# Patient Record
Sex: Female | Born: 2000 | Race: Black or African American | Hispanic: No | Marital: Single | Smoking: Never smoker
Health system: Southern US, Community
[De-identification: ages and names within clinical notes are randomized; demographics above are authoritative.]

## PROBLEM LIST (undated history)

## (undated) DIAGNOSIS — J45909 Unspecified asthma, uncomplicated: Secondary | ICD-10-CM

## (undated) HISTORY — PX: NO PAST SURGERIES: SHX2092

---
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
## 2018-05-18 ENCOUNTER — Other Ambulatory Visit: Payer: Self-pay

## 2018-05-18 ENCOUNTER — Encounter: Payer: Self-pay | Admitting: Emergency Medicine

## 2018-05-18 ENCOUNTER — Ambulatory Visit
Admission: EM | Admit: 2018-05-18 | Discharge: 2018-05-18 | Disposition: A | Discharge status: Home / Self Care | Payer: BLUE CROSS/BLUE SHIELD | Attending: Family Medicine | Admitting: Family Medicine

## 2018-05-18 DIAGNOSIS — Z202 Contact with and (suspected) exposure to infections with a predominantly sexual mode of transmission: Secondary | ICD-10-CM | POA: Diagnosis not present

## 2018-05-18 LAB — CHLAMYDIA/NGC RT PCR (ARMC ONLY)
Chlamydia Tr: DETECTED — AB
N gonorrhoeae: NOT DETECTED

## 2018-05-18 LAB — URINALYSIS, COMPLETE (UACMP) WITH MICROSCOPIC
BILIRUBIN URINE: NEGATIVE
GLUCOSE, UA: NEGATIVE mg/dL
KETONES UR: NEGATIVE mg/dL
Nitrite: NEGATIVE
PROTEIN: NEGATIVE mg/dL
Specific Gravity, Urine: 1.025 (ref 1.005–1.030)
pH: 5.5 (ref 5.0–8.0)

## 2018-05-18 LAB — WET PREP, GENITAL
Clue Cells Wet Prep HPF POC: NONE SEEN
Sperm: NONE SEEN
Trich, Wet Prep: NONE SEEN
Yeast Wet Prep HPF POC: NONE SEEN

## 2018-05-18 NOTE — ED Triage Notes (Signed)
Patient would like to be tested of STDs.  Patient denies vaginal discharge.  Patient reports some increase in urinary urgency.

## 2018-05-18 NOTE — ED Provider Notes (Signed)
[facility]    CSN: [id number] Arrival date & time: 05/18/18  0855  History   Chief Complaint Chief Complaint  Patient presents with  . Exposure to STD   HPI  18 year old female presents for STD testing.  Patient states that she had unprotected intercourse approximately 2 weeks ago.  Patient desires STD screening/testing.  Patient states that she is essentially asymptomatic.  No fever.  No abdominal pain/pelvic pain.  No vaginal discharge.  Patient does report that she has had some urinary urgency.  Patient would like HIV screening as well.  No other complaints.  PMH, Surgical Hx, Family Hx, Social History reviewed and updated as below.  Past Medical History:  Diagnosis Date  . Asthma    Past Surgical History:  Procedure Laterality Date  . NO PAST SURGERIES     OB History   No obstetric history on file.    Home Medications    Prior to Admission medications   Medication Sig Start Date End Date Taking? Authorizing Provider  etonogestrel (NEXPLANON) 68 MG IMPL implant 1 each by Subdermal route once.   Yes [provider]  mupirocin ointment (BACTROBAN) 2 % Apply three times a day for 7 days. 06/25/16   Renford Dills, NP    Family History Family History  Problem Relation Age of Onset  . Healthy Mother   . Healthy Father     Social History Social History   Tobacco Use  . Smoking status: Never Smoker  . Smokeless tobacco: Never Used  Substance Use Topics  . Alcohol use: No  . Drug use: No     Allergies   Patient has no known allergies.   Review of Systems Review of Systems  Constitutional: Negative for fever.  Gastrointestinal: Negative.   Genitourinary: Positive for urgency. Negative for pelvic pain and vaginal discharge.   Physical Exam Triage Vital Signs ED Triage Vitals  Enc Vitals Group     BP 05/18/18 0906 111/68     Pulse Rate 05/18/18 0906 71     Resp 05/18/18 0906 14     Temp 05/18/18 0906 99.1 F (37.3 C)     Temp  Source 05/18/18 0906 Oral     SpO2 05/18/18 0906 100 %     Weight 05/18/18 0904 112 lb (50.8 kg)     Height 05/18/18 0904 5\' 2"  (1.575 m)     Head Circumference --      Peak Flow --      Pain Score 05/18/18 0904 0     Pain Loc --      Pain Edu? --      Excl. in GC? --    Updated Vital Signs BP 111/68 (BP Location: Left Arm)   Pulse 71   Temp 99.1 F (37.3 C) (Oral)   Resp 14   Ht 5\' 2"  (1.575 m)   Wt 50.8 kg   LMP 05/03/2018 (Approximate)   SpO2 100%   BMI 20.49 kg/m   Visual Acuity Right Eye Distance:   Left Eye Distance:   Bilateral Distance:    Right Eye Near:   Left Eye Near:    Bilateral Near:     Physical Exam Vitals signs and nursing note reviewed.  Constitutional:      Appearance: Normal appearance.  HENT:     Head: Normocephalic and atraumatic.  Eyes:     General:        Right eye: No discharge.        Left  eye: No discharge.     Conjunctiva/sclera: Conjunctivae normal.  Cardiovascular:     Rate and Rhythm: Normal rate and regular rhythm.  Pulmonary:     Effort: Pulmonary effort is normal.     Breath sounds: Normal breath sounds.  Abdominal:     General: There is no distension.     Palpations: Abdomen is soft.     Tenderness: There is no abdominal tenderness.  Neurological:     Mental Status: She is alert.  Psychiatric:        Mood and Affect: Mood normal.        Behavior: Behavior normal.    UC Treatments / Results  Labs (all labs ordered are listed, but only abnormal results are displayed) Labs Reviewed  WET PREP, GENITAL - Abnormal; Notable for the following components:      Result Value   WBC, Wet Prep HPF POC MODERATE (*)    All other components within normal limits  CHLAMYDIA/NGC RT PCR ([facility] ONLY)  URINALYSIS, COMPLETE (UACMP) WITH MICROSCOPIC  RPR  HIV ANTIBODY (ROUTINE TESTING W REFLEX)    EKG None  Radiology No results found.  Procedures Procedures (including critical care time)  Medications Ordered in UC  Medications - No data to display  Initial Impression / Assessment and Plan / UC Course  I have reviewed the triage vital signs and the nursing notes.  Pertinent labs & imaging results that were available during my care of the patient were reviewed by me and considered in my medical decision making (see chart for details).    18 year old female presents with possible exposure to STD.  STD testing done today.  No evidence of UTI. Will call patient with results.  Final Clinical Impressions(s) / UC Diagnoses   Final diagnoses:  Possible exposure to STD   Discharge Instructions   None    ED Prescriptions    None     Controlled Substance Prescriptions [place] Controlled Substance Registry consulted? Not Applicable   Tommie Sams, DO 05/18/18 1004

## 2018-05-18 NOTE — Discharge Instructions (Signed)
We will call with the results.  Take care  Dr. Shoshanna Mcquitty  

## 2018-05-19 LAB — RPR: RPR: NONREACTIVE

## 2018-05-19 LAB — HIV ANTIBODY (ROUTINE TESTING W REFLEX): HIV Screen 4th Generation wRfx: NONREACTIVE

## 2018-05-20 ENCOUNTER — Telehealth: Payer: Self-pay | Admitting: Emergency Medicine

## 2018-05-20 MED ORDER — AZITHROMYCIN 250 MG PO TABS: ORAL_TABLET | ORAL | 0 refills | Status: DC

## 2018-05-20 NOTE — Telephone Encounter (Signed)
Pt advised. Med sent. Have filled out form for health department and faxed. Also advised pt to let any sexual partners she has had know she tested positive for chlamydia.

## 2018-05-20 NOTE — Telephone Encounter (Signed)
-----   Message from Tommie Sams, DO sent at 05/19/2018  3:46 PM EDT ----- STD testing positive for chlamydia. Notify patient and health dept. Send in Azithromycin 250 mg  Tablets - Sig 1000 mg once. No refills.

## 2018-12-19 ENCOUNTER — Ambulatory Visit
Admission: EM | Admit: 2018-12-19 | Discharge: 2018-12-19 | Disposition: A | Discharge status: Home / Self Care | Payer: BC Managed Care – PPO | Attending: Family Medicine | Admitting: Family Medicine

## 2018-12-19 ENCOUNTER — Other Ambulatory Visit: Payer: Self-pay

## 2018-12-19 DIAGNOSIS — Z202 Contact with and (suspected) exposure to infections with a predominantly sexual mode of transmission: Secondary | ICD-10-CM | POA: Diagnosis not present

## 2018-12-19 DIAGNOSIS — B9689 Other specified bacterial agents as the cause of diseases classified elsewhere: Secondary | ICD-10-CM | POA: Diagnosis not present

## 2018-12-19 DIAGNOSIS — N760 Acute vaginitis: Secondary | ICD-10-CM | POA: Diagnosis not present

## 2018-12-19 LAB — WET PREP, GENITAL
Sperm: NONE SEEN
Trich, Wet Prep: NONE SEEN
Yeast Wet Prep HPF POC: NONE SEEN

## 2018-12-19 LAB — HIV ANTIBODY (ROUTINE TESTING W REFLEX): HIV Screen 4th Generation wRfx: NONREACTIVE

## 2018-12-19 MED ORDER — METRONIDAZOLE 500 MG PO TABS: 500.0000 mg | ORAL_TABLET | Freq: Two times a day (BID) | ORAL | 0 refills | Status: DC

## 2018-12-19 NOTE — Discharge Instructions (Signed)
We will notify of results.  Safe sex.  Take care  Dr. Lacinda Axon

## 2018-12-19 NOTE — ED Provider Notes (Signed)
[facility]    CSN: [id number] Arrival date & time: 12/19/18  1015  History   Chief Complaint Chief Complaint  Patient presents with  . std testing   HPI  18 year old female presents for STD testing.  Patient has had chlamydia in the past and was asymptomatic.  Patient states that she desires STD testing today.  She states that she is currently feeling well.  She has no symptoms.  Denies vaginal discharge, abdominal pain, urinary symptoms.  Patient states that she has been sexually active and has had unprotected intercourse in the past few months.  She is not currently sexually active.  No other complaints or concerns at this time.  PMH, Surgical Hx, Family Hx, Social History reviewed and updated as below.  Past Medical History:  Diagnosis Date  . Asthma   Hx of Chlamydia  Past Surgical History:  Procedure Laterality Date  . NO PAST SURGERIES      OB History   No obstetric history on file.      Home Medications    Prior to Admission medications   Medication Sig Start Date End Date Taking? Authorizing Provider  etonogestrel (NEXPLANON) 68 MG IMPL implant 1 each by Subdermal route once.   Yes [provider]  metroNIDAZOLE (FLAGYL) 500 MG tablet Take 1 tablet (500 mg total) by mouth 2 (two) times daily. 12/19/18   Tommie Sams, DO    Family History Family History  Problem Relation Age of Onset  . Healthy Mother   . Healthy Father     Social History Social History   Tobacco Use  . Smoking status: Never Smoker  . Smokeless tobacco: Never Used  Substance Use Topics  . Alcohol use: No  . Drug use: No     Allergies   Patient has no known allergies.   Review of Systems Review of Systems  Constitutional: Negative.   Genitourinary: Negative.    Physical Exam Triage Vital Signs ED Triage Vitals  Enc Vitals Group     BP 12/19/18 1034 113/75     Pulse Rate 12/19/18 1034 83     Resp 12/19/18 1034 18     Temp 12/19/18 1034 98.3 F  (36.8 C)     Temp Source 12/19/18 1034 Oral     SpO2 12/19/18 1034 100 %     Weight 12/19/18 1035 112 lb (50.8 kg)     Height 12/19/18 1035 5' 2.5" (1.588 m)     Head Circumference --      Peak Flow --      Pain Score 12/19/18 1034 0     Pain Loc --      Pain Edu? --      Excl. in GC? --    Updated Vital Signs BP 113/75 (BP Location: Left Arm)   Pulse 83   Temp 98.3 F (36.8 C) (Oral)   Resp 18   Ht 5' 2.5" (1.588 m)   Wt 50.8 kg   SpO2 100%   BMI 20.16 kg/m   Visual Acuity Right Eye Distance:   Left Eye Distance:   Bilateral Distance:    Right Eye Near:   Left Eye Near:    Bilateral Near:     Physical Exam Vitals signs and nursing note reviewed.  Constitutional:      General: She is not in acute distress.    Appearance: Normal appearance. She is normal weight. She is not ill-appearing.  HENT:     Head: Normocephalic and  atraumatic.  Eyes:     General:        Right eye: No discharge.        Left eye: No discharge.     Conjunctiva/sclera: Conjunctivae normal.  Cardiovascular:     Rate and Rhythm: Normal rate and regular rhythm.     Heart sounds: No murmur.  Pulmonary:     Effort: Pulmonary effort is normal.     Breath sounds: Normal breath sounds. No wheezing, rhonchi or rales.  Abdominal:     General: There is no distension.     Palpations: Abdomen is soft.     Tenderness: There is no abdominal tenderness.  Neurological:     Mental Status: She is alert.  Psychiatric:        Mood and Affect: Mood normal.        Behavior: Behavior normal.    UC Treatments / Results  Labs (all labs ordered are listed, but only abnormal results are displayed) Labs Reviewed  WET PREP, GENITAL - Abnormal; Notable for the following components:      Result Value   Clue Cells Wet Prep HPF POC PRESENT (*)    WBC, Wet Prep HPF POC FEW (*)    All other components within normal limits  GC/CHLAMYDIA PROBE AMP  HIV ANTIBODY (ROUTINE TESTING W REFLEX)  HIV4GL SAVE TUBE  RPR     EKG   Radiology No results found.  Procedures Procedures (including critical care time)  Medications Ordered in UC Medications - No data to display  Initial Impression / Assessment and Plan / UC Course  I have reviewed the triage vital signs and the nursing notes.  Pertinent labs & imaging results that were available during my care of the patient were reviewed by me and considered in my medical decision making (see chart for details).    18 year old female presents for STD testing.  Found to have bacterial vaginosis.  Awaiting STD test results.  Flagyl as directed.   Final Clinical Impressions(s) / UC Diagnoses   Final diagnoses:  Possible exposure to STD  BV (bacterial vaginosis)     Discharge Instructions     We will notify of results.  Safe sex.  Take care  Dr. Lacinda Axon    ED Prescriptions    Medication Sig Dispense Auth. Provider   metroNIDAZOLE (FLAGYL) 500 MG tablet Take 1 tablet (500 mg total) by mouth 2 (two) times daily. 14 tablet Coral Spikes, DO     PDMP not reviewed this encounter.   Coral Spikes, DO 12/19/18 1059

## 2018-12-19 NOTE — ED Triage Notes (Signed)
Patient in today stating that she wants to be screened for STDs. Patient denies exposure or symptoms.

## 2018-12-20 LAB — RPR
RPR Ser Ql: REACTIVE — AB
RPR Titer: 1:1 {titer}

## 2018-12-21 LAB — GC/CHLAMYDIA PROBE AMP
Chlamydia trachomatis, NAA: NEGATIVE
Neisseria Gonorrhoeae by PCR: NEGATIVE

## 2018-12-23 ENCOUNTER — Telehealth (HOSPITAL_COMMUNITY): Payer: Self-pay | Admitting: Emergency Medicine

## 2018-12-23 LAB — T.PALLIDUM AB, TOTAL: T Pallidum Abs: NONREACTIVE

## 2018-12-23 NOTE — Telephone Encounter (Signed)
Pt returned call, told her about what the reactive RPR means and that we are waiting for the confirmation testing. Pt verbalized understanding. Pending T pallidum antibodies.

## 2018-12-23 NOTE — Telephone Encounter (Signed)
RPR titer is reactive, waiting on confirmatory testing. All other std testing is negative. Attempted to reach patient. No answer at this time.

## 2018-12-24 ENCOUNTER — Encounter (HOSPITAL_COMMUNITY): Payer: Self-pay

## 2019-05-20 ENCOUNTER — Ambulatory Visit
Admission: EM | Admit: 2019-05-20 | Discharge: 2019-05-20 | Disposition: A | Discharge status: Home / Self Care | Payer: BC Managed Care – PPO | Attending: Urgent Care | Admitting: Urgent Care

## 2019-05-20 ENCOUNTER — Ambulatory Visit (INDEPENDENT_AMBULATORY_CARE_PROVIDER_SITE_OTHER): Payer: BC Managed Care – PPO

## 2019-05-20 ENCOUNTER — Encounter: Payer: Self-pay | Admitting: Emergency Medicine

## 2019-05-20 ENCOUNTER — Other Ambulatory Visit: Payer: Self-pay

## 2019-05-20 DIAGNOSIS — S62115A Nondisplaced fracture of triquetrum [cuneiform] bone, left wrist, initial encounter for closed fracture: Secondary | ICD-10-CM | POA: Diagnosis not present

## 2019-05-20 DIAGNOSIS — M25532 Pain in left wrist: Secondary | ICD-10-CM | POA: Diagnosis not present

## 2019-05-20 MED ORDER — TRAMADOL HCL 50 MG PO TABS: 50.0000 mg | ORAL_TABLET | Freq: Two times a day (BID) | ORAL | 0 refills | Status: DC | PRN

## 2019-05-20 MED ORDER — MELOXICAM 15 MG PO TABS: 15.0000 mg | ORAL_TABLET | Freq: Every day | ORAL | 0 refills | Status: DC

## 2019-05-20 NOTE — ED Triage Notes (Addendum)
Pt c/o left wrist pain. Started about 5 days ago. She states that she thinks she slept wrong on it and has been hurting since. She is wearing a brace on upon arrival today. No known injury

## 2019-05-20 NOTE — Discharge Instructions (Addendum)
It was very nice seeing you today in clinic. Thank you for entrusting me with your care.   Rest, ice, and elevate wrist. Wear brace. Follow up with orthopedics. I have made you an appointment at [facility] in [place] TOMORROW at 3:00 PM.  If your symptoms/condition worsens, please seek follow up care either here or in the ER. Please remember, our [facility] providers are "right here with you" when you need [place].   Again, it was my pleasure to take care of you today. Thank you for choosing our clinic. I hope that you start to feel better quickly.   Quentin Mulling, MSN, APRN, FNP-C, CEN Advanced Practice Provider [facility]

## 2019-05-22 NOTE — ED Provider Notes (Addendum)
[place]   Name: Chrisette Man DOB: 08/23/2000 MRN: [id number] CSN: [id number] [facility]CP: [facility]  Arrival date and time:  05/20/19 1113  Chief Complaint:  Wrist Pain (left)  NOTE: Prior to seeing the patient today, I have reviewed the triage nursing documentation and vital signs. Clinical staff has updated patient's PMH/PSHx, current medication list, and drug allergies/intolerances to ensure comprehensive history available to assist in medical decision making.   History:   HPI: Latavia Goga is a 19 y.o. female who presents today with complaints of a 5 day history of atraumatic pain in her LEFT wrist. Patient suspects that pain is associated with her sleeping on her arm/wrist wrong. Patient reports that she is a "violent sleeper", however doesn't recall injuring herself. Patient describes pain as "throbbing". She has appreciated associated soft tissue swelling and painful AROM. Pain increases with flexion, extension, ulnar/radial deviation, and movement of her thumb. Patient denies previous injuries or surgeries to her wrist. In efforts to conservatively manage her symptoms at home, the patient notes that she has used IBU, which has helped to improve her symptoms to some degree. She presents to clinic today in a Velcro cockup splint that she obtained at her local pharmacy.   Past Medical History:  Diagnosis Date  . Asthma     Past Surgical History:  Procedure Laterality Date  . NO PAST SURGERIES      Family History  Problem Relation Age of Onset  . Healthy Mother   . Healthy Father     Social History   Tobacco Use  . Smoking status: Never Smoker  . Smokeless tobacco: Never Used  Substance Use Topics  . Alcohol use: No  . Drug use: No    There are no problems to display for this patient.   Home Medications:    Current Meds  Medication Sig  . etonogestrel (NEXPLANON) 68 MG IMPL implant 1 each by Subdermal route once.    Allergies:   Patient has no  known allergies.  Review of Systems (ROS):  Review of systems NEGATIVE unless otherwise noted in narrative H&P section.   Vital Signs: Today's Vitals   05/20/19 1127 05/20/19 1130 05/20/19 1333  BP:  122/82   Pulse:  87   Resp:  18   Temp:  98.6 F (37 C)   TempSrc:  Oral   SpO2:  100%   Weight: 110 lb (49.9 kg)    Height: 5\' 2"  (1.575 m)    PainSc: 7   7     Physical Exam: Physical Exam  Constitutional: She is oriented to person, place, and time and well-developed, well-nourished, and in no distress.  HENT:  Head: Normocephalic and atraumatic.  Eyes: Pupils are equal, round, and reactive to light.  Cardiovascular: Normal rate and intact distal pulses.  Pulmonary/Chest: Effort normal. No respiratory distress.  Musculoskeletal:     Left wrist: Swelling and tenderness present. No deformity, effusion or crepitus. Decreased range of motion (see HPI).     Comments: (+) PMS noted distally; color, temperature, and capillary refill all WNL.   Neurological: She is alert and oriented to person, place, and time. She has normal sensation and normal reflexes. Gait normal.  Skin: Skin is warm and dry. No rash noted. She is not diaphoretic.  Psychiatric: Mood, memory, affect and judgment normal.  Nursing note and vitals reviewed.   Urgent Care Treatments / Results:   Orders Placed This Encounter  Procedures  . DG Wrist Complete Left  LABS: PLEASE NOTE: all labs that were ordered this encounter are listed, however only abnormal results are displayed. Labs Reviewed - No data to display  EKG: -None  RADIOLOGY: DG Wrist Complete Left  Result Date: 05/20/2019 CLINICAL DATA:  Atraumatic pain and swelling of the left wrist for 1 week EXAM: LEFT WRIST - COMPLETE 3+ VIEW COMPARISON:  None. FINDINGS: No definite fracture or dislocation of the left wrist. There is an ossicle projecting dorsal to the carpus on lateral view, generally suggestive of a triquetral avulsion fragment although  larger than usually seen and corticated appearing. Otherwise normal appearance and alignment of the wrist. No significant degenerative change. Diffuse soft tissue edema about the wrist, particularly dorsal. IMPRESSION: 1. No definite fracture or dislocation of the left wrist. 2. There is an ossicle projecting dorsal to the carpus on lateral view, generally suggestive of a triquetral avulsion fragment although larger than usually seen and corticated appearing. Consider CT or MRI to further evaluate. 3. Diffuse soft tissue edema about the wrist, particularly dorsal. Electronically Signed   By: Lauralyn Primes M.D.   On: 05/20/2019 12:16    PROCEDURES: Procedures  MEDICATIONS RECEIVED THIS VISIT: Medications - No data to display  PERTINENT CLINICAL COURSE NOTES/UPDATES:   Initial Impression / Assessment and Plan / Urgent Care Course:  Pertinent labs & imaging results that were available during my care of the patient were personally reviewed by me and considered in my medical decision making (see lab/imaging section of note for values and interpretations).  Matina Rodier is a 19 y.o. female who presents to [facility] today with complaints of Wrist Pain (left)  Patient is well appearing overall in clinic today. She does not appear to be in any acute distress. Presenting symptoms (see HPI) and exam as documented above. Atraumatic pain x 5 days. Diagnostic radiographs of the LEFT wrist revealed diffuse soft tissue edema about the wrist with findings concerning for a triquetral avulsion fragment. CT or MRI recommended. Discussed findings with patient. Patient needs to be seen for further evaluation by orthopedics. I contacted [facility] and patient cannot be seen until next week. With the recommendations for further advanced imaging, I do not feel as if it is in the best interest of the patient to wait another week, as this has already been bothering her for a week. I took the liberty of also  contacting [facility] where I spoke with specialty provider who is willing to work patient in tomorrow to be seen. Will proceed as follows:   Continue to immobilize wrist in cockup splint.    Meloxicam daily for pain. Will also send in short course of Tramadol for more severe pain. Patient is opioid naive, thus she was advised to exercise caution with the use of the Tramadol; no driving.    Educated on complimentary modalities to help with her pain. Patient encouraged to rest, ice, and elevate wrist. Ice should be applied TID-QID for at least 15-20 minutes at a time; written information provided on today's AVS.   Name and office contact information provided on today's AVS for Jennet Maduro, PA-C at [facility]. Appointment has been secured for the patient on 05/21/2019 at 1500 in the [place], [place] office. Copies of the dictated radiology report and images on CD provided to patient to take with her to her appointment.   I have reviewed the follow up and strict return precautions for any new or worsening symptoms. Patient is aware of symptoms that would be deemed  urgent/emergent, and would thus require further evaluation either here or in the emergency department. At the time of discharge, she verbalized understanding and consent with the discharge plan as it was reviewed with her. All questions were fielded by provider and/or clinic staff prior to patient discharge.    Final Clinical Impressions / Urgent Care Diagnoses:   Final diagnoses:  Closed nondisplaced fracture of triquetrum of left wrist, initial encounter    New Prescriptions:  [place] Controlled Substance Registry consulted? Yes, I have consulted the [place] Controlled Substances Registry for this patient, and feel the risk/benefit ratio today is favorable for proceeding with this prescription for a controlled substance.  . Discussed use of controlled substance medication to treat her acute pain.  o Reviewed [place] STOP Act  regulations  o Clinic does not refill controlled substances over the phone without face to face evaluation.  . Safety precautions reviewed.  o Medications should not be bitten, chewed, sold, or taken with alcohol.  o Avoid use while working, driving, or operating heavy machinery.  o Side effects associated with the use of this particular medication reviewed. - Patient understands that this medication can cause CNS depression, increase her risk of falls, and even lead to overdose that may result in death, if used outside of the parameters that she and I discussed.  With all of this in mind, she knowingly accepts the risks and responsibilities associated with intended course of treatment, and elects to responsibly proceed as discussed.  Meds ordered this encounter  Medications  . meloxicam (MOBIC) 15 MG tablet    Sig: Take 1 tablet (15 mg total) by mouth daily.    Dispense:  30 tablet    Refill:  0  . traMADol (ULTRAM) 50 MG tablet    Sig: Take 1 tablet (50 mg total) by mouth every 12 (twelve) hours as needed.    Dispense:  10 tablet    Refill:  0    Recommended Follow up Care:  Patient encouraged to follow up with the following provider within the specified time frame, or sooner as dictated by the severity of her symptoms. As always, she was instructed that for any urgent/emergent care needs, she should seek care either here or in the emergency department for more immediate evaluation.  NOTE: This note was prepared using Lobbyist along with smaller Company secretary. Despite my best ability to proofread, there is the potential that transcriptional errors may still occur from this process, and are completely unintentional.   Karen Kitchens, NP 05/22/19 0104

## 2020-10-15 ENCOUNTER — Other Ambulatory Visit: Payer: Self-pay

## 2020-10-15 ENCOUNTER — Ambulatory Visit
Admission: RE | Admit: 2020-10-15 | Discharge: 2020-10-15 | Disposition: A | Discharge status: Home / Self Care | Payer: BC Managed Care – PPO | Source: Ambulatory Visit | Attending: Emergency Medicine | Admitting: Emergency Medicine

## 2020-10-15 VITALS — BP 130/78 | HR 104 | Temp 99.1°F | Resp 18

## 2020-10-15 DIAGNOSIS — J358 Other chronic diseases of tonsils and adenoids: Secondary | ICD-10-CM | POA: Insufficient documentation

## 2020-10-15 DIAGNOSIS — Z202 Contact with and (suspected) exposure to infections with a predominantly sexual mode of transmission: Secondary | ICD-10-CM | POA: Insufficient documentation

## 2020-10-15 NOTE — ED Triage Notes (Signed)
Patient presents for STD Testing.   Patient denies any symptoms or recent exposures.

## 2020-10-15 NOTE — Discharge Instructions (Addendum)
Your testing has been sent to the lab.  If your testing results as positive, you will be notified via phone and we will initiate treatment at that time.  If you do not receive a phone call from [place] within the next 2 to 3 days, check your MyChart for updated health information. Do not engage in sex until you know your results. If you test positive you will need to abstain from sex for 7 days and notify all partners.   For your tonsil stones, gargle with warm salt water several times daily to diss large stones.  You may also suck on hard candy to help as well.  Return for any worsening of symptoms to include throat closing sensation, abdominal pain, fever or chills.

## 2020-10-15 NOTE — ED Provider Notes (Signed)
SUBJECTIVE:  Amaliya Whitelaw is a very pleasant 20 y.o. Female presents with need for STD check.  Patient states that she is with a new partner.  Patient states that she is having no symptoms and no known exposure, but would like to be checked.  Patient also reports that she has noticed some white areas to her throat, but does not report any sore throat.  She does not report any fever, chills, vomiting, abdominal pain or vaginal discharge.  ROS: General/Constitutional: No fever, chills, or sweats GI: No abdominal pain, nausea/vomiting or diarrhea GU: No urinary frequency or dysuria Genitalia: As above Lymph: No swelling, red streaks or swollen lymph nodes Skin: No rashes or skin lesions   OBJECTIVE: Vitals:   10/15/20 1141  BP: 130/78  Pulse: (!) 104  Resp: 18  Temp: 99.1 F (37.3 C)  SpO2: 97%     General: Appears well-developed and well-nourished. No acute distress.  Mouth: Patent airway.  Bilateral tonsilloliths noted without white patchy exudate or erythema. Cardiovascular: Normal rate Pulm/Chest: No respiratory distress.  Neurological: Alert and oriented to person, place, and time.  Skin: Skin is warm and dry.  Psychiatric: Normal mood, affect, behavior, and thought content.  GU:  Deferred secondary to self collect specimen   Laboratory:  No orders of the defined types were placed in this encounter.  No results found for any visits on 10/15/20.   Assessment: 1. Possible exposure to STD - Cervicovaginal ancillary only; Standing - Cervicovaginal ancillary only  2. Tonsillolith   Plan:  MDM: Patient presents with need for STD check.  Patient states that she is with a new partner.  Patient states that she is having no symptoms and no known exposure, but would like to be checked.  Patient also reports that she has noticed some white areas to her throat, but does not report any sore throat.  She does not report any fever, chills, vomiting, abdominal pain or vaginal  discharge.  Chart review completed.  APTIMA swab obtained in clinic today to check for STD status.  Advised that if any of these tests result as positive we will treat as needed.  Also during examination observed tonsilloliths, advised the use of warm salt gargles to help, no concern for underlying bacterial etiology which would require antibiotics at this time.  Return as needed for any worsening of symptoms to include throat closing sensation, abdominal pain, fever or chills.  Patient verbalized understanding and agreed with plan.  Patient stable upon discharge.  Return as needed.  Instructions:    Discharge Instructions      Your testing has been sent to the lab.  If your testing results as positive, you will be notified via phone and we will initiate treatment at that time.  If you do not receive a phone call from [place] within the next 2 to 3 days, check your MyChart for updated health information. Do not engage in sex until you know your results. If you test positive you will need to abstain from sex for 7 days and notify all partners.   For your tonsil stones, gargle with warm salt water several times daily to diss large stones.  You may also suck on hard candy to help as well.  Return for any worsening of symptoms to include throat closing sensation, abdominal pain, fever or chills.         Amalia Greenhouse, FNP 10/15/20 1159

## 2020-10-17 ENCOUNTER — Ambulatory Visit
Admission: RE | Admit: 2020-10-17 | Discharge: 2020-10-17 | Disposition: A | Discharge status: Home / Self Care | Payer: BC Managed Care – PPO | Source: Ambulatory Visit | Attending: Family Medicine | Admitting: Family Medicine

## 2020-10-17 ENCOUNTER — Other Ambulatory Visit: Payer: Self-pay

## 2020-10-17 VITALS — BP 112/60 | HR 94 | Temp 99.3°F | Resp 18 | Ht 62.0 in | Wt 130.0 lb

## 2020-10-17 DIAGNOSIS — J029 Acute pharyngitis, unspecified: Secondary | ICD-10-CM

## 2020-10-17 MED ORDER — NAPROXEN 500 MG PO TABS: 500.0000 mg | ORAL_TABLET | Freq: Two times a day (BID) | ORAL | 0 refills | Status: AC | PRN

## 2020-10-17 NOTE — Discharge Instructions (Signed)
Strep negative. No evidence of bacterial infection.  Lots of fluids.  Warm salt water gargles.  Medication as prescribed.  Take care  Dr. Adriana Simas

## 2020-10-17 NOTE — ED Triage Notes (Addendum)
Pt c/o sore throat since Monday. Pt reports seeing some white spots but thinks they have resolved. Pt denies congestion, cough, f/n/v/d or other symptoms. Pt was evaluated on Friday and advised she has tonsil stones. Pt is concerned because her throat it not greatly improved.

## 2020-10-18 LAB — CERVICOVAGINAL ANCILLARY ONLY
Bacterial Vaginitis (gardnerella): POSITIVE — AB
Candida Glabrata: NEGATIVE
Candida Vaginitis: NEGATIVE
Chlamydia: NEGATIVE
Comment: NEGATIVE
Comment: NEGATIVE
Comment: NEGATIVE
Comment: NEGATIVE
Comment: NEGATIVE
Comment: NORMAL
Neisseria Gonorrhea: NEGATIVE
Trichomonas: NEGATIVE

## 2020-10-19 NOTE — ED Provider Notes (Signed)
[facility]    CSN: [id number] Arrival date & time: 10/17/20  1233      History   Chief Complaint Chief Complaint  Patient presents with   Sore Throat    HPI 20 year old female presents with sore throat.  Recently seen in [place] urgent care.  Was thought to have tonsil stones.'s was advised supportive care.  Patient states that she continues to have sore throat.  This has been persistent since Monday.  No other respiratory symptoms.  Pain 6/10 in severity.  Described as achy.  No known relieving factors.  No other complaints.  Past Surgical History:  Procedure Laterality Date   NO PAST SURGERIES      OB History   No obstetric history on file.      Home Medications    Prior to Admission medications   Medication Sig Start Date End Date Taking? Authorizing Provider  etonogestrel (NEXPLANON) 68 MG IMPL implant 1 each by Subdermal route once.   Yes [provider]  naproxen (NAPROSYN) 500 MG tablet Take 1 tablet (500 mg total) by mouth 2 (two) times daily as needed for moderate pain. 10/17/20  Yes Tommie Sams, DO    Family History Family History  Problem Relation Age of Onset   Healthy Mother    Healthy Father     Social History Social History   Tobacco Use   Smoking status: Never   Smokeless tobacco: Never  Vaping Use   Vaping Use: Never used  Substance Use Topics   Alcohol use: No   Drug use: No     Allergies   Patient has no known allergies.   Review of Systems Review of Systems Per HPI  Physical Exam Triage Vital Signs ED Triage Vitals  Enc Vitals Group     BP 10/17/20 1324 112/60     Pulse Rate 10/17/20 1324 94     Resp 10/17/20 1324 18     Temp 10/17/20 1324 99.3 F (37.4 C)     Temp Source 10/17/20 1324 Oral     SpO2 10/17/20 1324 100 %     Weight 10/17/20 1322 130 lb (59 kg)     Height 10/17/20 1322 5\' 2"  (1.575 m)     Head Circumference --      Peak Flow --      Pain Score 10/17/20 1322 6     Pain Loc  --      Pain Edu? --      Excl. in [place]? --    No data found.  Updated Vital Signs BP 112/60 (BP Location: Left Arm)   Pulse 94   Temp 99.3 F (37.4 C) (Oral)   Resp 18   Ht 5\' 2"  (1.575 m)   Wt 59 kg   LMP 10/15/2020 (Exact Date)   SpO2 100%   BMI 23.78 kg/m   Visual Acuity Right Eye Distance:   Left Eye Distance:   Bilateral Distance:    Right Eye Near:   Left Eye Near:    Bilateral Near:     Physical Exam Vitals and nursing note reviewed.  Constitutional:      General: She is not in acute distress.    Appearance: Normal appearance. She is not ill-appearing.  HENT:     Head: Normocephalic and atraumatic.     Mouth/Throat:     Pharynx: Oropharynx is clear. No oropharyngeal exudate.  Eyes:     General:        Right eye:  No discharge.        Left eye: No discharge.     Conjunctiva/sclera: Conjunctivae normal.  Cardiovascular:     Rate and Rhythm: Normal rate and regular rhythm.  Pulmonary:     Effort: Pulmonary effort is normal.     Breath sounds: Normal breath sounds. No wheezing, rhonchi or rales.  Neurological:     Mental Status: She is alert.  Psychiatric:        Mood and Affect: Mood normal.        Behavior: Behavior normal.     UC Treatments / Results  Labs (all labs ordered are listed, but only abnormal results are displayed) Labs Reviewed  CULTURE, GROUP A STREP [facility])  POCT RAPID STREP A, ED / UC    EKG   Radiology No results found.  Procedures Procedures (including critical care time)  Medications Ordered in UC Medications - No data to display  Initial Impression / Assessment and Plan / UC Course  I have reviewed the triage vital signs and the nursing notes.  Pertinent labs & imaging results that were available during my care of the patient were reviewed by me and considered in my medical decision making (see chart for details).    20 year old female presents with viral pharyngitis.  Strep negative.  Naproxen as  directed.  Final Clinical Impressions(s) / UC Diagnoses   Final diagnoses:  Viral pharyngitis     Discharge Instructions      Strep negative. No evidence of bacterial infection.  Lots of fluids.  Warm salt water gargles.  Medication as prescribed.  Take care  Dr. Adriana Simas    ED Prescriptions     Medication Sig Dispense Auth. Provider   naproxen (NAPROSYN) 500 MG tablet Take 1 tablet (500 mg total) by mouth 2 (two) times daily as needed for moderate pain. 30 tablet Tommie Sams, DO      PDMP not reviewed this encounter.   Everlene Other [place], [place] 10/19/20 [phone]

## 2020-10-20 LAB — CULTURE, GROUP A STREP (THRC)

## 2020-10-21 ENCOUNTER — Telehealth (HOSPITAL_COMMUNITY): Payer: Self-pay | Admitting: Emergency Medicine

## 2020-10-21 MED ORDER — METRONIDAZOLE 500 MG PO TABS: 500.0000 mg | ORAL_TABLET | Freq: Two times a day (BID) | ORAL | 0 refills | Status: DC

## 2021-01-15 ENCOUNTER — Ambulatory Visit: Admission: EM | Admit: 2021-01-15 | Discharge status: Absent without leave | Payer: BC Managed Care – PPO

## 2021-01-17 ENCOUNTER — Ambulatory Visit: Payer: Self-pay

## 2021-01-30 ENCOUNTER — Ambulatory Visit
Admission: EM | Admit: 2021-01-30 | Discharge: 2021-01-30 | Disposition: A | Discharge status: Home / Self Care | Payer: BC Managed Care – PPO | Attending: Physician Assistant | Admitting: Physician Assistant

## 2021-01-30 DIAGNOSIS — Z5321 Procedure and treatment not carried out due to patient leaving prior to being seen by health care provider: Secondary | ICD-10-CM | POA: Diagnosis not present

## 2021-01-30 LAB — WET PREP, GENITAL
Clue Cells Wet Prep HPF POC: NONE SEEN
Sperm: NONE SEEN
Trich, Wet Prep: NONE SEEN
WBC, Wet Prep HPF POC: NONE SEEN — AB (ref <10)
Yeast Wet Prep HPF POC: NONE SEEN

## 2021-02-17 ENCOUNTER — Ambulatory Visit: Payer: BC Managed Care – PPO

## 2021-03-15 ENCOUNTER — Ambulatory Visit: Payer: Self-pay

## 2021-03-16 ENCOUNTER — Other Ambulatory Visit: Payer: Self-pay

## 2021-03-16 ENCOUNTER — Ambulatory Visit
Admission: EM | Admit: 2021-03-16 | Discharge: 2021-03-16 | Disposition: A | Discharge status: Home / Self Care | Payer: BC Managed Care – PPO | Attending: Physician Assistant | Admitting: Physician Assistant

## 2021-03-16 ENCOUNTER — Encounter: Payer: Self-pay | Admitting: Emergency Medicine

## 2021-03-16 DIAGNOSIS — N760 Acute vaginitis: Secondary | ICD-10-CM

## 2021-03-16 DIAGNOSIS — N898 Other specified noninflammatory disorders of vagina: Secondary | ICD-10-CM | POA: Diagnosis not present

## 2021-03-16 LAB — WET PREP, GENITAL
Trich, Wet Prep: NONE SEEN
WBC, Wet Prep HPF POC: NONE SEEN — AB (ref <10)
Yeast Wet Prep HPF POC: NONE SEEN

## 2021-03-16 MED ORDER — METRONIDAZOLE 500 MG PO TABS: 500.0000 mg | ORAL_TABLET | Freq: Two times a day (BID) | ORAL | 0 refills | Status: AC

## 2021-03-16 NOTE — ED Provider Notes (Signed)
[facility]    CSN: [id number] Arrival date & time: 03/16/21  1632      History   Chief Complaint Chief Complaint  Patient presents with   Vaginal Discharge    HPI Diana Olson is a 21 y.o. female presenting for 5-day history of white-colored vaginal discharge and vaginal itching/irritation.  Patient says the discharge has pretty much cleared up.  She says she is now left with the itching and irritation.  She denies any abdominal pelvic pain, back pain, dysuria.  No concern for STIs but would like to be tested anyway.  LMP 02/23/2021.  HPI  Past Medical History:  Diagnosis Date   Asthma     There are no problems to display for this patient.   Past Surgical History:  Procedure Laterality Date   NO PAST SURGERIES      OB History   No obstetric history on file.      Home Medications    Prior to Admission medications   Medication Sig Start Date End Date Taking? Authorizing Provider  etonogestrel (NEXPLANON) 68 MG IMPL implant 1 each by Subdermal route once.    [provider]  metroNIDAZOLE (FLAGYL) 500 MG tablet Take 1 tablet (500 mg total) by mouth 2 (two) times daily for 7 days. 03/16/21 03/23/21  Laurene Footman B, PA-C  naproxen (NAPROSYN) 500 MG tablet Take 1 tablet (500 mg total) by mouth 2 (two) times daily as needed for moderate pain. 10/17/20   Coral Spikes, DO    Family History Family History  Problem Relation Age of Onset   Healthy Mother    Healthy Father     Social History Social History   Tobacco Use   Smoking status: Never   Smokeless tobacco: Never  Vaping Use   Vaping Use: Never used  Substance Use Topics   Alcohol use: No   Drug use: No     Allergies   Patient has no known allergies.   Review of Systems Review of Systems  Constitutional:  Negative for fatigue and fever.  Gastrointestinal:  Negative for abdominal pain.  Genitourinary:  Positive for vaginal discharge. Negative for dysuria, flank pain,  frequency, hematuria, urgency, vaginal bleeding and vaginal pain.  Musculoskeletal:  Negative for back pain.  Skin:  Negative for rash.    Physical Exam Triage Vital Signs ED Triage Vitals  Enc Vitals Group     BP 03/16/21 1646 128/63     Pulse Rate 03/16/21 1646 84     Resp 03/16/21 1646 18     Temp 03/16/21 1646 98.6 F (37 C)     Temp Source 03/16/21 1646 Oral     SpO2 03/16/21 1646 100 %     Weight 03/16/21 1646 130 lb 1.1 oz (59 kg)     Height 03/16/21 1646 5\' 2"  (1.575 m)     Head Circumference --      Peak Flow --      Pain Score 03/16/21 1645 0     Pain Loc --      Pain Edu? --      Excl. in [place]? --    No data found.  Updated Vital Signs BP 128/63 (BP Location: Left Arm)    Pulse 84    Temp 98.6 F (37 C) (Oral)    Resp 18    Ht 5\' 2"  (1.575 m)    Wt 130 lb 1.1 oz (59 kg)    LMP 02/23/2021 (Approximate)    SpO2  100%    BMI 23.79 kg/m      Physical Exam Vitals and nursing note reviewed.  Constitutional:      General: She is not in acute distress.    Appearance: Normal appearance. She is not ill-appearing or toxic-appearing.  HENT:     Head: Normocephalic and atraumatic.  Eyes:     General: No scleral icterus.       Right eye: No discharge.        Left eye: No discharge.     Conjunctiva/sclera: Conjunctivae normal.  Cardiovascular:     Rate and Rhythm: Normal rate and regular rhythm.     Heart sounds: Normal heart sounds.  Pulmonary:     Effort: Pulmonary effort is normal. No respiratory distress.     Breath sounds: Normal breath sounds.  Abdominal:     Palpations: Abdomen is soft.     Tenderness: There is no abdominal tenderness.  Musculoskeletal:     Cervical back: Neck supple.  Skin:    General: Skin is dry.  Neurological:     General: No focal deficit present.     Mental Status: She is alert. Mental status is at baseline.     Motor: No weakness.     Gait: Gait normal.  Psychiatric:        Mood and Affect: Mood normal.        Behavior: Behavior  normal.        Thought Content: Thought content normal.     UC Treatments / Results  Labs (all labs ordered are listed, but only abnormal results are displayed) Labs Reviewed  WET PREP, GENITAL - Abnormal; Notable for the following components:      Result Value   Clue Cells Wet Prep HPF POC PRESENT (*)    WBC, Wet Prep HPF POC NONE SEEN (*)    All other components within normal limits  CERVICOVAGINAL ANCILLARY ONLY    EKG   Radiology No results found.  Procedures Procedures (including critical care time)  Medications Ordered in UC Medications - No data to display  Initial Impression / Assessment and Plan / UC Course  I have reviewed the triage vital signs and the nursing notes.  Pertinent labs & imaging results that were available during my care of the patient were reviewed by me and considered in my medical decision making (see chart for details).  21 year old female presenting for vaginal discharge, itching and irritation.  Patient elects to perform a vaginal self swab for wet prep and GC/chlamydia.  Wet prep shows positive clue cells.  Discussed results with patient.  Advised her she has BV.  She has had this before.  Sent metronidazole to pharmacy.  Discussed how to access results of her other labs.  Advised her we will contact her if anything is positive.   Final Clinical Impressions(s) / UC Diagnoses   Final diagnoses:  Acute vaginitis  Vaginal discharge   Discharge Instructions   None    ED Prescriptions     Medication Sig Dispense Auth. Provider   metroNIDAZOLE (FLAGYL) 500 MG tablet Take 1 tablet (500 mg total) by mouth 2 (two) times daily for 7 days. 14 tablet Gretta Cool      PDMP not reviewed this encounter.   Danton Clap, PA-C 03/16/21 1726

## 2021-03-16 NOTE — ED Triage Notes (Signed)
Pt c/o white, clumpy vaginal discharge. Started about 4 days ago. She states she also has itching and irritation. No concerns for STDs.

## 2021-03-17 LAB — CERVICOVAGINAL ANCILLARY ONLY
Chlamydia: NEGATIVE
Comment: NEGATIVE
Comment: NORMAL
Neisseria Gonorrhea: NEGATIVE

## 2021-05-23 ENCOUNTER — Other Ambulatory Visit: Payer: Self-pay

## 2021-05-23 ENCOUNTER — Ambulatory Visit
Admission: RE | Admit: 2021-05-23 | Discharge: 2021-05-23 | Disposition: A | Discharge status: Home / Self Care | Payer: BC Managed Care – PPO | Source: Ambulatory Visit | Attending: Emergency Medicine | Admitting: Emergency Medicine

## 2021-05-23 VITALS — BP 128/67 | HR 87 | Temp 99.1°F | Resp 18

## 2021-05-23 DIAGNOSIS — Z3201 Encounter for pregnancy test, result positive: Secondary | ICD-10-CM

## 2021-05-23 LAB — PREGNANCY, URINE: Preg Test, Ur: POSITIVE — AB

## 2021-05-23 NOTE — ED Provider Notes (Signed)
?[place] ? ? ? ?CSN: [id number] ?Arrival date & time: 05/23/21  1655 ? ? ?  ? ?History   ?Chief Complaint ?Chief Complaint  ?Patient presents with  ? Possible Pregnancy  ? ? ?HPI ?Diana Olson is a 21 y.o. female.  ? ?HPI ? ?21 year old female here requesting pregnancy test. ? ?Patient reports that her last menstrual period was on 04/25/2021 and that she has been experiencing some slight soreness in both of her breast so she took a home pregnancy test and was positive.  She states that she has not had any abdominal pain, nausea, or vaginal bleeding.  She states that she is not actively trying to get pregnant but she is also not on any birth control at present and is having unprotected sex. ? ?Past Medical History:  ?Diagnosis Date  ? Asthma   ? ? ?There are no problems to display for this patient. ? ? ?Past Surgical History:  ?Procedure Laterality Date  ? NO PAST SURGERIES    ? ? ?OB History   ?No obstetric history on file. ?  ? ? ? ?Home Medications   ? ?Prior to Admission medications   ?Medication Sig Start Date End Date Taking? Authorizing Provider  ?etonogestrel (NEXPLANON) 68 MG IMPL implant 1 each by Subdermal route once.    [provider]  ?naproxen (NAPROSYN) 500 MG tablet Take 1 tablet (500 mg total) by mouth 2 (two) times daily as needed for moderate pain. 10/17/20   Coral Spikes, DO  ? ? ?Family History ?Family History  ?Problem Relation Age of Onset  ? Healthy Mother   ? Healthy Father   ? ? ?Social History ?Social History  ? ?Tobacco Use  ? Smoking status: Never  ? Smokeless tobacco: Never  ?Vaping Use  ? Vaping Use: Never used  ?Substance Use Topics  ? Alcohol use: No  ? Drug use: No  ? ? ? ?Allergies   ?Patient has no known allergies. ? ? ?Review of Systems ?Review of Systems  ?Gastrointestinal:  Negative for abdominal pain, nausea and vomiting.  ? ? ?Physical Exam ?Triage Vital Signs ?ED Triage Vitals  ?Enc Vitals Group  ?   BP 05/23/21 1724 128/67  ?   Pulse Rate 05/23/21  1724 87  ?   Resp 05/23/21 1724 18  ?   Temp 05/23/21 1724 99.1 ?F (37.3 ?C)  ?   Temp Source 05/23/21 1724 Oral  ?   SpO2 05/23/21 1724 97 %  ?   Weight --   ?   Height --   ?   Head Circumference --   ?   Peak Flow --   ?   Pain Score 05/23/21 1720 0  ?   Pain Loc --   ?   Pain Edu? --   ?   Excl. in [place]? --   ? ?No data found. ? ?Updated Vital Signs ?BP 128/67 (BP Location: Left Arm)   Pulse 87   Temp 99.1 ?F (37.3 ?C) (Oral)   Resp 18   LMP 04/25/2021 (Exact Date)   SpO2 97%  ? ?Visual Acuity ?Right Eye Distance:   ?Left Eye Distance:   ?Bilateral Distance:   ? ?Right Eye Near:   ?Left Eye Near:    ?Bilateral Near:    ? ?Physical Exam ?Vitals and nursing note reviewed.  ?Constitutional:   ?   Appearance: Normal appearance. She is not ill-appearing.  ?HENT:  ?   Head: Normocephalic and atraumatic.  ?Cardiovascular:  ?  Rate and Rhythm: Normal rate and regular rhythm.  ?   Pulses: Normal pulses.  ?   Heart sounds: Normal heart sounds. No murmur heard. ?  No friction rub. No gallop.  ?Pulmonary:  ?   Effort: Pulmonary effort is normal.  ?   Breath sounds: Normal breath sounds. No wheezing, rhonchi or rales.  ?Skin: ?   General: Skin is warm and dry.  ?   Capillary Refill: Capillary refill takes less than 2 seconds.  ?   Findings: No erythema or rash.  ?Neurological:  ?   General: No focal deficit present.  ?   Mental Status: She is alert and oriented to person, place, and time.  ?Psychiatric:     ?   Mood and Affect: Mood normal.     ?   Behavior: Behavior normal.     ?   Thought Content: Thought content normal.     ?   Judgment: Judgment normal.  ? ? ? ?UC Treatments / Results  ?Labs ?(all labs ordered are listed, but only abnormal results are displayed) ?Labs Reviewed  ?PREGNANCY, URINE - Abnormal; Notable for the following components:  ?    Result Value  ? Preg Test, Ur POSITIVE (*)   ? All other components within normal limits  ? ? ?EKG ? ? ?Radiology ?No results found. ? ?Procedures ?Procedures (including  critical care time) ? ?Medications Ordered in UC ?Medications - No data to display ? ?Initial Impression / Assessment and Plan / UC Course  ?I have reviewed the triage vital signs and the nursing notes. ? ?Pertinent labs & imaging results that were available during my care of the patient were reviewed by me and considered in my medical decision making (see chart for details). ? ?Patient is a nontoxic-appearing 21 year old female here requesting confirmation of positive home pregnancy test.  Her last menstrual period was exactly a month ago.  She has been experiencing some breast tenderness but denies any nausea, vomiting, or vaginal bleeding.  Patient states that she is not actively trying to get pregnant but she is also not on any birth control and is having unprotected sex.  Urine was collected at triage and is pending. ? ?Urine pregnancy test positive. ? ?We will discharge patient home with a diagnosis of positive pregnancy test. ? ? ?Final Clinical Impressions(s) / UC Diagnoses  ? ?Final diagnoses:  ?Pregnancy test positive  ? ? ? ?Discharge Instructions   ? ?  ?Your pregnancy test today was positive. ? ? ? ? ?ED Prescriptions   ?None ?  ? ?PDMP not reviewed this encounter. ?  ?Margarette Canada, NP ?05/23/21 1737 ? ?

## 2021-05-23 NOTE — Discharge Instructions (Addendum)
Your pregnancy test today was positive. ?

## 2021-05-23 NOTE — ED Triage Notes (Signed)
Pt presents for possible pregnancy.  Took home test and it was positive.  LMP 04/25/2021.  Slightly sore breasts, no abd pain, no vaginal bleeding.  ?

## 2021-07-01 ENCOUNTER — Ambulatory Visit
Admission: RE | Admit: 2021-07-01 | Discharge: 2021-07-01 | Disposition: A | Discharge status: Home / Self Care | Payer: BC Managed Care – PPO | Source: Ambulatory Visit | Attending: Physician Assistant | Admitting: Physician Assistant

## 2021-07-01 VITALS — BP 122/60 | HR 89 | Temp 98.6°F | Resp 16

## 2021-07-01 DIAGNOSIS — Z113 Encounter for screening for infections with a predominantly sexual mode of transmission: Secondary | ICD-10-CM | POA: Insufficient documentation

## 2021-07-01 DIAGNOSIS — Z202 Contact with and (suspected) exposure to infections with a predominantly sexual mode of transmission: Secondary | ICD-10-CM | POA: Diagnosis not present

## 2021-07-01 DIAGNOSIS — Z3A10 10 weeks gestation of pregnancy: Secondary | ICD-10-CM | POA: Diagnosis present

## 2021-07-01 LAB — WET PREP, GENITAL
Clue Cells Wet Prep HPF POC: NONE SEEN
Sperm: NONE SEEN
Trich, Wet Prep: NONE SEEN
WBC, Wet Prep HPF POC: >=10 — AB (ref <10)
Yeast Wet Prep HPF POC: NONE SEEN

## 2021-07-01 NOTE — ED Triage Notes (Signed)
Patient presents to Urgent Care for STD testing. Asymptomatic.  ?

## 2021-07-01 NOTE — ED Provider Notes (Signed)
?[facility] ? ? ? ?CSN: [id number] ?Arrival date & time: 07/01/21  1446 ? ? ?  ? ?History   ?Chief Complaint ?Chief Complaint  ?Patient presents with  ? SEXUALLY TRANSMITTED DISEASE  ?  std check-up - Entered by patient  ? ? ?HPI ?Diana Olson is a 21 y.o. female presenting for STI screening.  Patient reports she recently found out that her sexual partner was sexually active with someone else.  She says he does not have any symptoms and she does not presently have any symptoms that she wanted to make sure since she is [redacted] weeks pregnant.  She denies any dysuria, frequency or urgency, skin rashes or lesions, vaginal discharge, itching or odor.  No abdominal or pelvic pain.  Patient says everything is going smoothly with her pregnancy so far.  Personal history of chlamydia 4 months ago.  She has no other complaints. ? ?HPI ? ?Past Medical History:  ?Diagnosis Date  ? Asthma   ? ? ?There are no problems to display for this patient. ? ? ?Past Surgical History:  ?Procedure Laterality Date  ? NO PAST SURGERIES    ? ? ?OB History   ? ? Gravida  ?1  ? Para  ?   ? Term  ?   ? Preterm  ?   ? AB  ?   ? Living  ?   ?  ? ? SAB  ?   ? IAB  ?   ? Ectopic  ?   ? Multiple  ?   ? Live Births  ?   ?   ?  ?  ? ? ? ?Home Medications   ? ?Prior to Admission medications   ?Medication Sig Start Date End Date Taking? Authorizing Provider  ?etonogestrel (NEXPLANON) 68 MG IMPL implant 1 each by Subdermal route once.    [provider]  ?naproxen (NAPROSYN) 500 MG tablet Take 1 tablet (500 mg total) by mouth 2 (two) times daily as needed for moderate pain. 10/17/20   Tommie Sams, DO  ? ? ?Family History ?Family History  ?Problem Relation Age of Onset  ? Healthy Mother   ? Healthy Father   ? ? ?Social History ?Social History  ? ?Tobacco Use  ? Smoking status: Never  ? Smokeless tobacco: Never  ?Vaping Use  ? Vaping Use: Never used  ?Substance Use Topics  ? Alcohol use: No  ? Drug use: No  ? ? ? ?Allergies   ?Patient has no  known allergies. ? ? ?Review of Systems ?Review of Systems  ?Cardiovascular:  Negative for chest pain.  ?Gastrointestinal:  Negative for abdominal pain.  ?Genitourinary:  Negative for dysuria, flank pain, frequency, hematuria, urgency, vaginal bleeding, vaginal discharge and vaginal pain.  ?Musculoskeletal:  Negative for back pain.  ?Skin:  Negative for rash.  ? ? ?Physical Exam ?Triage Vital Signs ?ED Triage Vitals  ?Enc Vitals Group  ?   BP 07/01/21 1456 122/60  ?   Pulse Rate 07/01/21 1456 89  ?   Resp 07/01/21 1456 16  ?   Temp 07/01/21 1456 98.6 ?F (37 ?C)  ?   Temp Source 07/01/21 1456 Oral  ?   SpO2 07/01/21 1456 100 %  ?   Weight --   ?   Height --   ?   Head Circumference --   ?   Peak Flow --   ?   Pain Score 07/01/21 1454 0  ?   Pain Loc --   ?  Pain Edu? --   ?   Excl. in [place]? --   ? ?No data found. ? ?Updated Vital Signs ?BP 122/60 (BP Location: Left Arm)   Pulse 89   Temp 98.6 ?F (37 ?C) (Oral)   Resp 16   LMP 04/25/2021 (Exact Date)   SpO2 100%  ? ? ?Physical Exam ?Vitals and nursing note reviewed.  ?Constitutional:   ?   General: She is not in acute distress. ?   Appearance: Normal appearance. She is not ill-appearing or toxic-appearing.  ?HENT:  ?   Head: Normocephalic and atraumatic.  ?Eyes:  ?   General: No scleral icterus.    ?   Right eye: No discharge.     ?   Left eye: No discharge.  ?   Conjunctiva/sclera: Conjunctivae normal.  ?Cardiovascular:  ?   Rate and Rhythm: Normal rate and regular rhythm.  ?   Heart sounds: Normal heart sounds.  ?Pulmonary:  ?   Effort: Pulmonary effort is normal. No respiratory distress.  ?   Breath sounds: Normal breath sounds.  ?Abdominal:  ?   Palpations: Abdomen is soft.  ?   Tenderness: There is no abdominal tenderness. There is no right CVA tenderness or left CVA tenderness.  ?Musculoskeletal:  ?   Cervical back: Neck supple.  ?Skin: ?   General: Skin is dry.  ?Neurological:  ?   General: No focal deficit present.  ?   Mental Status: She is alert. Mental  status is at baseline.  ?   Motor: No weakness.  ?   Gait: Gait normal.  ?Psychiatric:     ?   Mood and Affect: Mood normal.     ?   Behavior: Behavior normal.     ?   Thought Content: Thought content normal.  ? ? ? ?UC Treatments / Results  ?Labs ?(all labs ordered are listed, but only abnormal results are displayed) ?Labs Reviewed  ?WET PREP, GENITAL  ?CERVICOVAGINAL ANCILLARY ONLY  ? ? ?EKG ? ? ?Radiology ?No results found. ? ?Procedures ?Procedures (including critical care time) ? ?Medications Ordered in UC ?Medications - No data to display ? ?Initial Impression / Assessment and Plan / UC Course  ?I have reviewed the triage vital signs and the nursing notes. ? ?Pertinent labs & imaging results that were available during my care of the patient were reviewed by me and considered in my medical decision making (see chart for details). ? ?21 year old female presenting for STI screening.  She is presently [redacted] weeks pregnant.  Her sexual partner was sexually active with another person.  She denies any symptoms and partner does not have any symptoms.  Patient elects to forego pelvic exam.  She performs vaginal self swab.  Test sent for GC/chlamydia/trichomonas.  Discussed access results through MyChart.  We will call and treat if anything is positive since she does not have any symptoms at this time.  Reviewed giving [place] a call sooner if she were to develop any symptoms.  Advised to follow-up as needed.  Continue to follow-up with OB/GYN. ? ? ?Final Clinical Impressions(s) / UC Diagnoses  ? ?Final diagnoses:  ?Screening examination for sexually transmitted disease  ?[redacted] weeks gestation of pregnancy  ? ?Discharge Instructions   ?None ?  ? ?ED Prescriptions   ?None ?  ? ?PDMP not reviewed this encounter. ?  ?Shirlee Latch, PA-C ?07/01/21 1518 ? ?

## 2021-07-05 LAB — CERVICOVAGINAL ANCILLARY ONLY
Chlamydia: NEGATIVE
Comment: NEGATIVE
Comment: NEGATIVE
Comment: NORMAL
Neisseria Gonorrhea: NEGATIVE
Trichomonas: NEGATIVE

## 2021-11-03 ENCOUNTER — Ambulatory Visit: Payer: Self-pay

## 2021-11-03 ENCOUNTER — Ambulatory Visit: Admit: 2021-11-03 | Discharge status: Against Medical Advice | Payer: BC Managed Care – PPO

## 2021-11-06 IMAGING — CR DG WRIST COMPLETE 3+V*L*
4 series · 4 of 4 positions shown · non-contrast
Comparison: None.

CLINICAL DATA: Atraumatic pain and swelling of the left wrist for 1
week

EXAM:
LEFT WRIST - COMPLETE 3+ VIEW

[wrist pa]
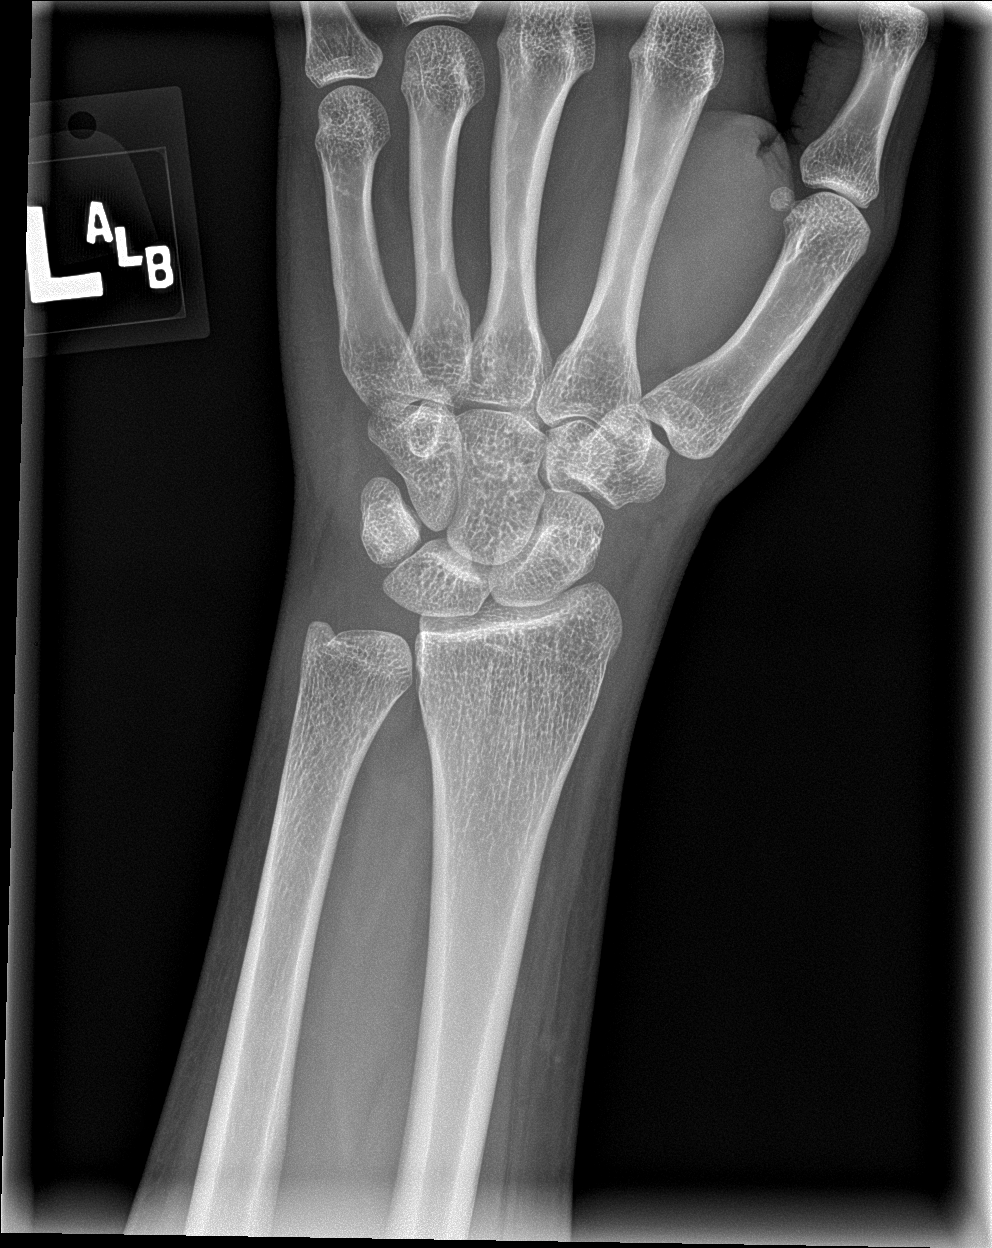

[wrist obl]
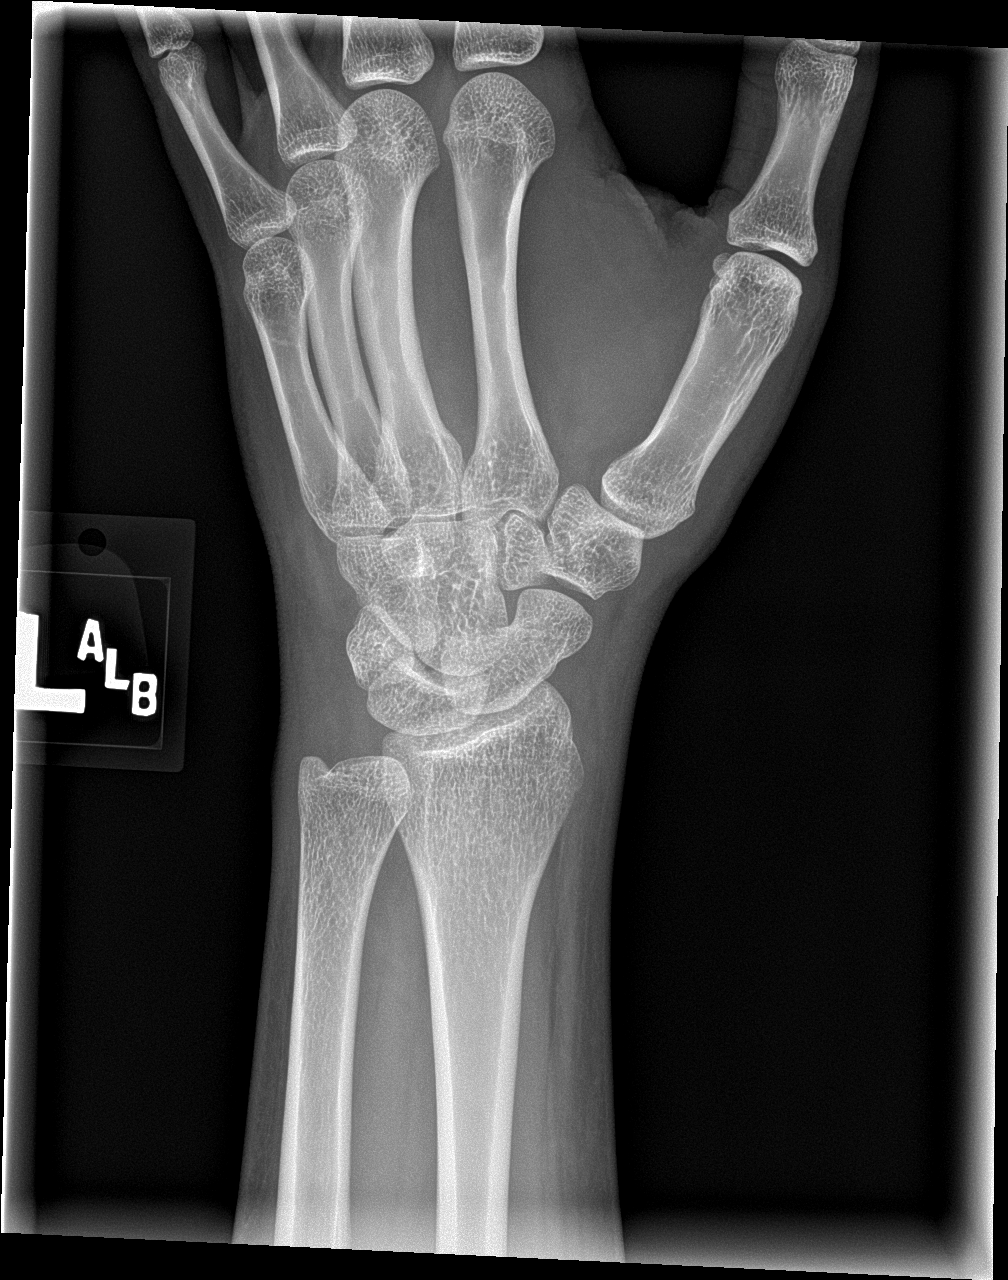

[wrist lat]
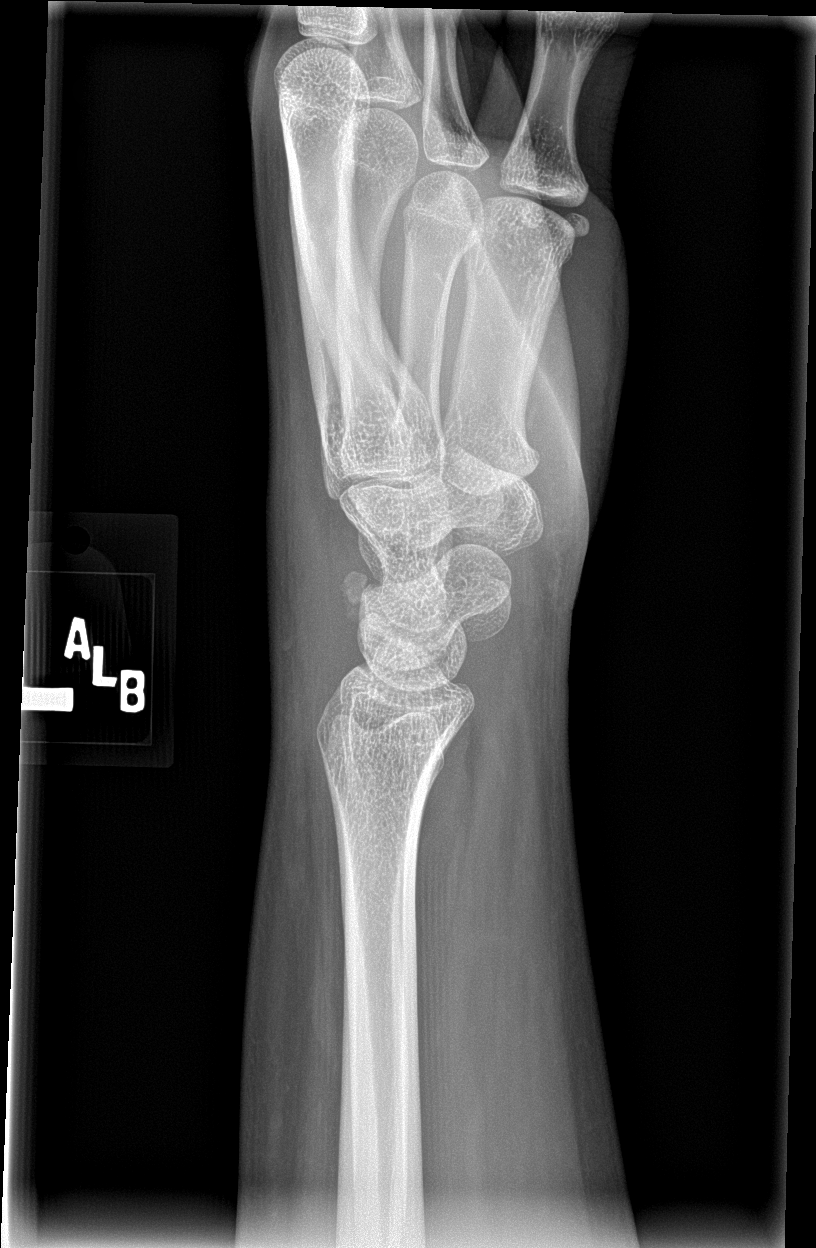

[wrist navicular]
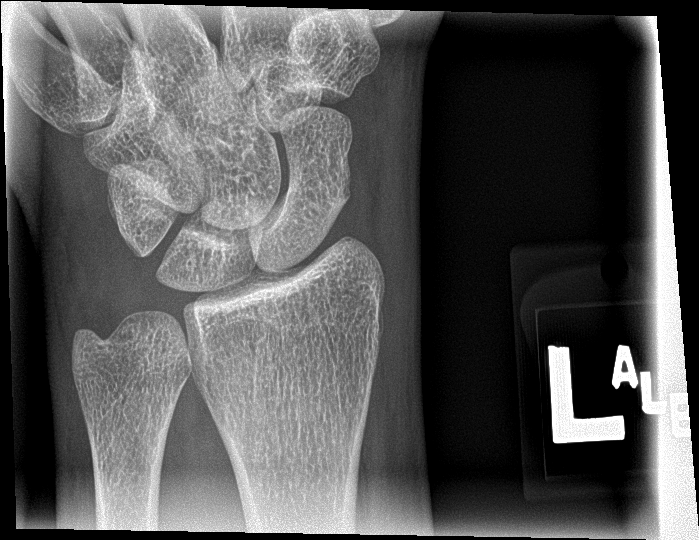

[4 of 4 positions shown; findings below may reference images not displayed]

FINDINGS: No definite fracture or dislocation of the left wrist. There is an
ossicle projecting dorsal to the carpus on lateral view, generally
suggestive of a triquetral avulsion fragment although larger than
usually seen and corticated appearing. Otherwise normal appearance
and alignment of the wrist. No significant degenerative change.
Diffuse soft tissue edema about the wrist, particularly dorsal.
IMPRESSION: 1. No definite fracture or dislocation of the left wrist.
2. There is an ossicle projecting dorsal to the carpus on lateral
view, generally suggestive of a triquetral avulsion fragment
although larger than usually seen and corticated appearing. Consider
CT or MRI to further evaluate.
3. Diffuse soft tissue edema about the wrist, particularly dorsal.

## 2022-01-13 ENCOUNTER — Ambulatory Visit: Admit: 2022-01-13 | Payer: BC Managed Care – PPO

## 2023-01-20 ENCOUNTER — Emergency Department
Admission: EM | Admit: 2023-01-20 | Discharge: 2023-01-20 | Disposition: A | Discharge status: Home / Self Care | Payer: BLUE CROSS/BLUE SHIELD | Attending: Emergency Medicine | Admitting: Emergency Medicine

## 2023-01-20 ENCOUNTER — Other Ambulatory Visit: Payer: Self-pay

## 2023-01-20 ENCOUNTER — Emergency Department: Payer: BLUE CROSS/BLUE SHIELD

## 2023-01-20 DIAGNOSIS — S31119A Laceration without foreign body of abdominal wall, unspecified quadrant without penetration into peritoneal cavity, initial encounter: Secondary | ICD-10-CM

## 2023-01-20 DIAGNOSIS — Z23 Encounter for immunization: Secondary | ICD-10-CM | POA: Insufficient documentation

## 2023-01-20 DIAGNOSIS — W260XXA Contact with knife, initial encounter: Secondary | ICD-10-CM | POA: Diagnosis not present

## 2023-01-20 DIAGNOSIS — S21212A Laceration without foreign body of left back wall of thorax without penetration into thoracic cavity, initial encounter: Secondary | ICD-10-CM | POA: Diagnosis present

## 2023-01-20 LAB — CBC WITH DIFFERENTIAL/PLATELET
Abs Immature Granulocytes: 0.01 10*3/uL (ref 0.00–0.07)
Basophils Absolute: 0 10*3/uL (ref 0.0–0.1)
Basophils Relative: 0 %
Eosinophils Absolute: 0 10*3/uL (ref 0.0–0.5)
Eosinophils Relative: 0 %
HCT: 37.8 % (ref 36.0–46.0)
Hemoglobin: 12.3 g/dL (ref 12.0–15.0)
Immature Granulocytes: 0 %
Lymphocytes Relative: 21 %
Lymphs Abs: 1.5 10*3/uL (ref 0.7–4.0)
MCH: 28.2 pg (ref 26.0–34.0)
MCHC: 32.5 g/dL (ref 30.0–36.0)
MCV: 86.7 fL (ref 80.0–100.0)
Monocytes Absolute: 0.5 10*3/uL (ref 0.1–1.0)
Monocytes Relative: 7 %
Neutro Abs: 4.8 10*3/uL (ref 1.7–7.7)
Neutrophils Relative %: 72 %
Platelets: 292 10*3/uL (ref 150–400)
RBC: 4.36 MIL/uL (ref 3.87–5.11)
RDW: 13.9 % (ref 11.5–15.5)
WBC: 6.8 10*3/uL (ref 4.0–10.5)
nRBC: 0 % (ref 0.0–0.2)

## 2023-01-20 LAB — BASIC METABOLIC PANEL
Anion gap: 8 (ref 5–15)
BUN: 13 mg/dL (ref 6–20)
CO2: 22 mmol/L (ref 22–32)
Calcium: 8.6 mg/dL — ABNORMAL LOW (ref 8.9–10.3)
Chloride: 105 mmol/L (ref 98–111)
Creatinine, Ser: 0.69 mg/dL (ref 0.44–1.00)
GFR, Estimated: >60 mL/min (ref >60)
Glucose, Bld: 109 mg/dL — ABNORMAL HIGH (ref 70–99)
Potassium: 3 mmol/L — ABNORMAL LOW (ref 3.5–5.1)
Sodium: 135 mmol/L (ref 135–145)

## 2023-01-20 LAB — POC URINE PREG, ED: Preg Test, Ur: NEGATIVE

## 2023-01-20 MED ORDER — LIDOCAINE-EPINEPHRINE 2 %-1:100000 IJ SOLN
20.0000 mL | Freq: Once | INTRAMUSCULAR | Status: AC
  Administered 2023-01-20: 20 mL
  Filled 2023-01-20: qty 1

## 2023-01-20 MED ORDER — ACETAMINOPHEN 325 MG PO TABS
650.0000 mg | ORAL_TABLET | Freq: Once | ORAL | Status: AC
  Administered 2023-01-20: 650 mg via ORAL
  Filled 2023-01-20: qty 2

## 2023-01-20 MED ORDER — IOHEXOL 300 MG/ML  SOLN
100.0000 mL | Freq: Once | INTRAMUSCULAR | Status: AC | PRN
  Administered 2023-01-20: 100 mL via INTRAVENOUS

## 2023-01-20 MED ORDER — TETANUS-DIPHTH-ACELL PERTUSSIS 5-2.5-18.5 LF-MCG/0.5 IM SUSY
0.5000 mL | PREFILLED_SYRINGE | Freq: Once | INTRAMUSCULAR | Status: AC
  Administered 2023-01-20: 0.5 mL via INTRAMUSCULAR
  Filled 2023-01-20: qty 0.5

## 2023-01-20 NOTE — ED Triage Notes (Addendum)
Pt to ED via POV from home. Pt reports she was moving and using a pocket knife and cut her left side. Pt presents with open wound left side with exposing fatty tissue. Pt states pain 6/10. Bleeding controlled. Laceration covered.

## 2023-01-20 NOTE — ED Notes (Signed)
Pt wanting to leave without completing care at this time. RN educated pt to stay due to risk of injury to herself. RN educated pt about her wound needing to be stitched. Pt stating she will wait on MD to sew up the wound. MD made aware.

## 2023-01-20 NOTE — ED Notes (Signed)
MD suturing pt's wound at this time

## 2023-01-20 NOTE — ED Notes (Signed)
Pt presents to ED for penetrating wound. Pt states that she had a pocket knife open on her bed and did not see it and sat down and it penetrated her side. Bleeding controlled at this time. Pt expresses 3 on 0-10 pain scale. Pt denies any radiating abdominal pain. Pt states pain is local to the site of the wound. Pt denies any SOB. Pt states that there was not a large volume of blood lost at the time of injury.
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
## 2023-01-20 NOTE — ED Notes (Signed)
Pt verbalized understanding about coming back to th ED for any signs of infection or new/worsening symptoms. Pt verbalize understanding about d/c instructions. Pt ambulatory at d/c.
